# Patient Record
Sex: Female | Born: 1970 | Race: White | Hispanic: No | Smoking: Former smoker
Health system: Southern US, Community
[De-identification: ages and names within clinical notes are randomized; demographics above are authoritative.]

---

## 2004-06-30 ENCOUNTER — Emergency Department: Payer: Self-pay | Admitting: Emergency Medicine

## 2005-09-11 ENCOUNTER — Emergency Department: Payer: Self-pay | Admitting: Emergency Medicine

## 2007-09-13 ENCOUNTER — Emergency Department: Payer: Self-pay | Admitting: Emergency Medicine

## 2008-09-14 ENCOUNTER — Ambulatory Visit: Payer: Self-pay | Admitting: Family Medicine

## 2008-10-18 ENCOUNTER — Emergency Department: Payer: Self-pay | Admitting: Emergency Medicine

## 2009-11-09 ENCOUNTER — Encounter: Payer: Self-pay | Admitting: Otolaryngology

## 2009-11-10 ENCOUNTER — Encounter: Payer: Self-pay | Admitting: Otolaryngology

## 2010-04-02 ENCOUNTER — Emergency Department: Payer: Self-pay | Admitting: Emergency Medicine

## 2011-01-13 ENCOUNTER — Emergency Department: Payer: Self-pay | Admitting: Emergency Medicine

## 2014-04-04 ENCOUNTER — Emergency Department: Payer: Self-pay | Admitting: Emergency Medicine

## 2014-04-04 LAB — CBC
HCT: 45.6 % (ref 35.0–47.0)
HGB: 14.9 g/dL (ref 12.0–16.0)
MCH: 30.3 pg (ref 26.0–34.0)
MCHC: 32.6 g/dL (ref 32.0–36.0)
MCV: 93 fL (ref 80–100)
Platelet: 223 10*3/uL (ref 150–440)
RBC: 4.92 10*6/uL (ref 3.80–5.20)
RDW: 13.9 % (ref 11.5–14.5)
WBC: 10 10*3/uL (ref 3.6–11.0)

## 2014-04-04 LAB — BASIC METABOLIC PANEL
Anion Gap: 7 (ref 7–16)
BUN: 9 mg/dL (ref 7–18)
CALCIUM: 8.7 mg/dL (ref 8.5–10.1)
CHLORIDE: 108 mmol/L — AB (ref 98–107)
Co2: 28 mmol/L (ref 21–32)
Creatinine: 0.77 mg/dL (ref 0.60–1.30)
EGFR (African American): >60
EGFR (Non-African Amer.): >60
Glucose: 104 mg/dL — ABNORMAL HIGH (ref 65–99)
Osmolality: 284 (ref 275–301)
Potassium: 3.7 mmol/L (ref 3.5–5.1)
SODIUM: 143 mmol/L (ref 136–145)

## 2014-04-04 LAB — TROPONIN I: Troponin-I: <0.02 ng/mL

## 2014-04-04 LAB — D-DIMER(ARMC): D-Dimer: 457 ng/ml

## 2014-04-15 ENCOUNTER — Emergency Department: Payer: Self-pay | Admitting: Emergency Medicine

## 2014-04-15 LAB — COMPREHENSIVE METABOLIC PANEL
ALBUMIN: 3.3 g/dL — AB (ref 3.4–5.0)
ALK PHOS: 141 U/L — AB
ALT: 61 U/L
ANION GAP: 9 (ref 7–16)
BUN: 11 mg/dL (ref 7–18)
Bilirubin,Total: 0.2 mg/dL (ref 0.2–1.0)
CALCIUM: 8.8 mg/dL (ref 8.5–10.1)
Chloride: 107 mmol/L (ref 98–107)
Co2: 27 mmol/L (ref 21–32)
Creatinine: 0.78 mg/dL (ref 0.60–1.30)
EGFR (African American): >60
EGFR (Non-African Amer.): >60
Glucose: 117 mg/dL — ABNORMAL HIGH (ref 65–99)
Osmolality: 285 (ref 275–301)
Potassium: 3.9 mmol/L (ref 3.5–5.1)
SGOT(AST): 38 U/L — ABNORMAL HIGH (ref 15–37)
Sodium: 143 mmol/L (ref 136–145)
TOTAL PROTEIN: 6.6 g/dL (ref 6.4–8.2)

## 2014-04-15 LAB — CBC
HCT: 44.6 % (ref 35.0–47.0)
HGB: 14.9 g/dL (ref 12.0–16.0)
MCH: 30.9 pg (ref 26.0–34.0)
MCHC: 33.5 g/dL (ref 32.0–36.0)
MCV: 92 fL (ref 80–100)
Platelet: 270 10*3/uL (ref 150–440)
RBC: 4.83 10*6/uL (ref 3.80–5.20)
RDW: 13.8 % (ref 11.5–14.5)
WBC: 8.8 10*3/uL (ref 3.6–11.0)

## 2014-04-15 LAB — PRO B NATRIURETIC PEPTIDE: B-Type Natriuretic Peptide: 18 pg/mL (ref 0–125)

## 2014-04-15 LAB — TROPONIN I: Troponin-I: <0.02 ng/mL

## 2016-06-14 ENCOUNTER — Emergency Department (HOSPITAL_COMMUNITY): Payer: No Typology Code available for payment source

## 2016-06-14 ENCOUNTER — Emergency Department (HOSPITAL_COMMUNITY)
Admission: EM | Admit: 2016-06-14 | Discharge: 2016-06-14 | Disposition: A | Discharge status: Home / Self Care | Payer: No Typology Code available for payment source | Attending: Emergency Medicine | Admitting: Emergency Medicine

## 2016-06-14 ENCOUNTER — Encounter (HOSPITAL_COMMUNITY): Payer: Self-pay | Admitting: Emergency Medicine

## 2016-06-14 DIAGNOSIS — M5020 Other cervical disc displacement, unspecified cervical region: Secondary | ICD-10-CM | POA: Insufficient documentation

## 2016-06-14 DIAGNOSIS — M5030 Other cervical disc degeneration, unspecified cervical region: Secondary | ICD-10-CM

## 2016-06-14 DIAGNOSIS — Y92410 Unspecified street and highway as the place of occurrence of the external cause: Secondary | ICD-10-CM | POA: Diagnosis not present

## 2016-06-14 DIAGNOSIS — Y939 Activity, unspecified: Secondary | ICD-10-CM | POA: Diagnosis not present

## 2016-06-14 DIAGNOSIS — S199XXA Unspecified injury of neck, initial encounter: Secondary | ICD-10-CM | POA: Diagnosis present

## 2016-06-14 DIAGNOSIS — R51 Headache: Secondary | ICD-10-CM | POA: Insufficient documentation

## 2016-06-14 DIAGNOSIS — Y999 Unspecified external cause status: Secondary | ICD-10-CM | POA: Diagnosis not present

## 2016-06-14 DIAGNOSIS — Z79899 Other long term (current) drug therapy: Secondary | ICD-10-CM | POA: Diagnosis not present

## 2016-06-14 DIAGNOSIS — S161XXA Strain of muscle, fascia and tendon at neck level, initial encounter: Secondary | ICD-10-CM | POA: Diagnosis not present

## 2016-06-14 MED ORDER — OXYCODONE-ACETAMINOPHEN 5-325 MG PO TABS
2.0000 | ORAL_TABLET | Freq: Once | ORAL | Status: AC
  Administered 2016-06-14: 2 via ORAL
  Filled 2016-06-14: qty 2

## 2016-06-14 MED ORDER — HYDROCODONE-ACETAMINOPHEN 5-325 MG PO TABS: 1.0000 | ORAL_TABLET | Freq: Four times a day (QID) | ORAL | 0 refills | Status: DC | PRN

## 2016-06-14 MED ORDER — METHOCARBAMOL 500 MG PO TABS
500.0000 mg | ORAL_TABLET | Freq: Once | ORAL | Status: AC
  Administered 2016-06-14: 500 mg via ORAL
  Filled 2016-06-14: qty 1

## 2016-06-14 MED ORDER — METHOCARBAMOL 500 MG PO TABS: 500.0000 mg | ORAL_TABLET | Freq: Two times a day (BID) | ORAL | 0 refills | Status: DC

## 2016-06-14 MED ORDER — NAPROXEN 500 MG PO TABS: 500.0000 mg | ORAL_TABLET | Freq: Two times a day (BID) | ORAL | 0 refills | Status: DC
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
## 2016-06-14 NOTE — ED Notes (Signed)
Patient transported to CT 

## 2016-06-14 NOTE — Discharge Instructions (Signed)
1. Medications: robaxin, naproxyn, vicodin, usual home medications 2. Treatment: rest, drink plenty of fluids, wear cervical collar until reassess, alternate ice and heat 3. Follow Up: Please followup with neurosurgery at the earliest appointment for discussion of your diagnoses and further evaluation after today's visit; if you do not have a primary care doctor use the resource guide provided to find one;  Return to the ER for worsening back pain, difficulty walking, loss of bowel or bladder control, neurologic symptoms or other concerning symptoms
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
## 2017-03-17 ENCOUNTER — Encounter: Payer: Self-pay | Admitting: Emergency Medicine

## 2017-03-17 ENCOUNTER — Emergency Department
Admission: EM | Admit: 2017-03-17 | Discharge: 2017-03-17 | Disposition: A | Discharge status: Home / Self Care | Payer: Self-pay | Attending: Emergency Medicine | Admitting: Emergency Medicine

## 2017-03-17 DIAGNOSIS — H6122 Impacted cerumen, left ear: Secondary | ICD-10-CM | POA: Insufficient documentation

## 2017-03-17 DIAGNOSIS — Z87891 Personal history of nicotine dependence: Secondary | ICD-10-CM | POA: Insufficient documentation

## 2017-03-17 NOTE — Discharge Instructions (Signed)
By over-the-counter DEBrox. Use once a week prior to a shower. If you have any problems with the ear please return to the emergency Department or go to [facility] clinic acute care

## 2017-03-17 NOTE — ED Provider Notes (Signed)
[facility] Emergency Department Provider Note  ____________________________________________   First MD Initiated Contact with Patient 03/17/17 1722     (approximate)  I have reviewed the triage vital signs and the nursing notes.   HISTORY  Chief Complaint Ear Fullness    HPI Courtney Hicks is a 46 y.o. female complains of not being able to hear out of the left ear. States she thinks she might have broken a Q-tip off while trying to get wax out. Denies fever or chills. No cold symptoms. No drainage from the ear. No trauma that she knows of other than having a Q-tip in the ear. Has tried to open the area up by holding her nose.   History reviewed. No pertinent past medical history.  There are no active problems to display for this patient.   History reviewed. No pertinent surgical history.  Prior to Admission medications   Not on File    Allergies Patient has no known allergies.  No family history on file.  Social History Social History  Substance Use Topics  . Smoking status: Former Games developer  . Smokeless tobacco: Not on file  . Alcohol use Not on file    Review of Systems  Constitutional: No fever/chills Eyes: No visual changes. ENT: No sore throat.Positive. Loss Respiratory: Denies cough Genitourinary: Negative for dysuria. Musculoskeletal: Negative for back pain. Skin: Negative for rash.    ____________________________________________   PHYSICAL EXAM:  VITAL SIGNS: ED Triage Vitals  Enc Vitals Group     BP 03/17/17 1707 121/72     Pulse Rate 03/17/17 1707 80     Resp 03/17/17 1707 16     Temp 03/17/17 1707 98.6 F (37 C)     Temp Source 03/17/17 1707 Oral     SpO2 03/17/17 1707 98 %     Weight 03/17/17 1708 210 lb (95.3 kg)     Height 03/17/17 1708  (1.6 m)     Head Circumference --      Peak Flow --      Pain Score 03/17/17 1713 0     Pain Loc --      Pain Edu? --      Excl. in GC? --      Constitutional: Alert and oriented. Well appearing and in no acute distress. Eyes: Conjunctivae are normal.  Head: Atraumatic. Nose: No congestion/rhinnorhea. Mouth/Throat: Mucous membranes are moist.   Ears: Left ear has cerumen impaction in the canal. NO fb noted Cardiovascular: Normal rate, regular rhythm. Respiratory: Normal respiratory effort.  No retractions GU: deferred Musculoskeletal: FROM all extremities, warm and well perfused Neurologic:  Normal speech and language.  Skin:  Skin is warm, dry and intact. No rash noted. Psychiatric: Mood and affect are normal. Speech and behavior are normal.  ____________________________________________   LABS (all labs ordered are listed, but only abnormal results are displayed)  Labs Reviewed - No data to display ____________________________________________   ____________________________________________  RADIOLOGY  none  ____________________________________________   PROCEDURES  Procedure(s) performed: Ear irrigation performed by RN      ____________________________________________   INITIAL IMPRESSION / ASSESSMENT AND PLAN / ED COURSE  Pertinent labs & imaging results that were available during my care of the patient were reviewed by me and considered in my medical decision making (see chart for details).  H and appears well. Patient has a cerumen impaction which is causing hearing loss.      ____________________________________________   FINAL CLINICAL IMPRESSION(S) / ED DIAGNOSES  Final diagnoses:  Impacted cerumen of left ear      NEW MEDICATIONS STARTED DURING THIS VISIT:  Discharge Medication List as of 03/17/2017  5:45 PM       Note:  This document was prepared using Dragon voice recognition software and may include unintentional dictation errors.    Merrily Brittle, MD 03/19/17 2223

## 2017-03-17 NOTE — ED Triage Notes (Signed)
States was cleaning ear with q tip last night, R, felt pop and trouble hearing since.
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
## 2020-11-30 ENCOUNTER — Other Ambulatory Visit (HOSPITAL_BASED_OUTPATIENT_CLINIC_OR_DEPARTMENT_OTHER): Payer: Self-pay

## 2020-11-30 ENCOUNTER — Ambulatory Visit: Payer: Self-pay

## 2021-08-08 DIAGNOSIS — R252 Cramp and spasm: Secondary | ICD-10-CM | POA: Diagnosis not present

## 2021-08-08 DIAGNOSIS — M25562 Pain in left knee: Secondary | ICD-10-CM | POA: Diagnosis not present

## 2021-09-02 DIAGNOSIS — M25562 Pain in left knee: Secondary | ICD-10-CM | POA: Diagnosis not present

## 2022-06-16 DIAGNOSIS — Z79899 Other long term (current) drug therapy: Secondary | ICD-10-CM | POA: Diagnosis not present

## 2022-06-16 DIAGNOSIS — R600 Localized edema: Secondary | ICD-10-CM | POA: Diagnosis not present

## 2022-09-22 DIAGNOSIS — I4510 Unspecified right bundle-branch block: Secondary | ICD-10-CM | POA: Diagnosis not present

## 2022-09-22 DIAGNOSIS — R600 Localized edema: Secondary | ICD-10-CM | POA: Diagnosis not present

## 2022-09-22 DIAGNOSIS — R42 Dizziness and giddiness: Secondary | ICD-10-CM | POA: Diagnosis not present

## 2022-09-22 DIAGNOSIS — R9431 Abnormal electrocardiogram [ECG] [EKG]: Secondary | ICD-10-CM | POA: Diagnosis not present

## 2022-09-26 DIAGNOSIS — E7800 Pure hypercholesterolemia, unspecified: Secondary | ICD-10-CM | POA: Diagnosis not present

## 2022-09-26 DIAGNOSIS — R5383 Other fatigue: Secondary | ICD-10-CM | POA: Diagnosis not present

## 2022-09-26 DIAGNOSIS — D539 Nutritional anemia, unspecified: Secondary | ICD-10-CM | POA: Diagnosis not present

## 2022-09-26 DIAGNOSIS — Z79899 Other long term (current) drug therapy: Secondary | ICD-10-CM | POA: Diagnosis not present

## 2022-09-26 DIAGNOSIS — E559 Vitamin D deficiency, unspecified: Secondary | ICD-10-CM | POA: Diagnosis not present

## 2022-09-26 DIAGNOSIS — E88819 Insulin resistance, unspecified: Secondary | ICD-10-CM | POA: Diagnosis not present

## 2022-09-26 DIAGNOSIS — Z131 Encounter for screening for diabetes mellitus: Secondary | ICD-10-CM | POA: Diagnosis not present

## 2022-10-02 DIAGNOSIS — R002 Palpitations: Secondary | ICD-10-CM | POA: Diagnosis not present

## 2022-10-20 DIAGNOSIS — I4510 Unspecified right bundle-branch block: Secondary | ICD-10-CM | POA: Diagnosis not present

## 2022-10-23 DIAGNOSIS — R7303 Prediabetes: Secondary | ICD-10-CM | POA: Diagnosis not present

## 2022-10-23 DIAGNOSIS — Z79899 Other long term (current) drug therapy: Secondary | ICD-10-CM | POA: Diagnosis not present

## 2022-11-09 DIAGNOSIS — Z6841 Body Mass Index (BMI) 40.0 and over, adult: Secondary | ICD-10-CM | POA: Diagnosis not present

## 2022-11-09 DIAGNOSIS — R600 Localized edema: Secondary | ICD-10-CM | POA: Diagnosis not present

## 2022-11-09 DIAGNOSIS — E7800 Pure hypercholesterolemia, unspecified: Secondary | ICD-10-CM | POA: Diagnosis not present

## 2022-11-09 DIAGNOSIS — F17200 Nicotine dependence, unspecified, uncomplicated: Secondary | ICD-10-CM | POA: Diagnosis not present

## 2022-11-23 DIAGNOSIS — R635 Abnormal weight gain: Secondary | ICD-10-CM | POA: Diagnosis not present

## 2022-11-23 DIAGNOSIS — E559 Vitamin D deficiency, unspecified: Secondary | ICD-10-CM | POA: Diagnosis not present

## 2022-11-23 DIAGNOSIS — F17210 Nicotine dependence, cigarettes, uncomplicated: Secondary | ICD-10-CM | POA: Diagnosis not present

## 2022-11-23 DIAGNOSIS — R7303 Prediabetes: Secondary | ICD-10-CM | POA: Diagnosis not present

## 2022-11-23 DIAGNOSIS — N914 Secondary oligomenorrhea: Secondary | ICD-10-CM | POA: Diagnosis not present

## 2022-12-22 DIAGNOSIS — R635 Abnormal weight gain: Secondary | ICD-10-CM | POA: Diagnosis not present

## 2022-12-22 DIAGNOSIS — Z79899 Other long term (current) drug therapy: Secondary | ICD-10-CM | POA: Diagnosis not present

## 2022-12-22 DIAGNOSIS — Z6841 Body Mass Index (BMI) 40.0 and over, adult: Secondary | ICD-10-CM | POA: Diagnosis not present

## 2022-12-22 DIAGNOSIS — R7303 Prediabetes: Secondary | ICD-10-CM | POA: Diagnosis not present

## 2023-01-19 DIAGNOSIS — R7303 Prediabetes: Secondary | ICD-10-CM | POA: Diagnosis not present

## 2023-01-19 DIAGNOSIS — Z79899 Other long term (current) drug therapy: Secondary | ICD-10-CM | POA: Diagnosis not present

## 2023-01-19 DIAGNOSIS — R635 Abnormal weight gain: Secondary | ICD-10-CM | POA: Diagnosis not present

## 2023-01-19 DIAGNOSIS — Z6841 Body Mass Index (BMI) 40.0 and over, adult: Secondary | ICD-10-CM | POA: Diagnosis not present

## 2023-01-23 DIAGNOSIS — Z79899 Other long term (current) drug therapy: Secondary | ICD-10-CM | POA: Diagnosis not present

## 2023-02-02 DIAGNOSIS — E559 Vitamin D deficiency, unspecified: Secondary | ICD-10-CM | POA: Diagnosis not present

## 2023-02-02 DIAGNOSIS — F17210 Nicotine dependence, cigarettes, uncomplicated: Secondary | ICD-10-CM | POA: Diagnosis not present

## 2023-02-02 DIAGNOSIS — Z1231 Encounter for screening mammogram for malignant neoplasm of breast: Secondary | ICD-10-CM | POA: Diagnosis not present

## 2023-02-02 DIAGNOSIS — N914 Secondary oligomenorrhea: Secondary | ICD-10-CM | POA: Diagnosis not present

## 2023-02-02 DIAGNOSIS — R7303 Prediabetes: Secondary | ICD-10-CM | POA: Diagnosis not present

## 2023-03-02 DIAGNOSIS — F17210 Nicotine dependence, cigarettes, uncomplicated: Secondary | ICD-10-CM | POA: Diagnosis not present

## 2023-03-02 DIAGNOSIS — Z23 Encounter for immunization: Secondary | ICD-10-CM | POA: Diagnosis not present

## 2023-03-02 DIAGNOSIS — R7303 Prediabetes: Secondary | ICD-10-CM | POA: Diagnosis not present

## 2023-03-02 DIAGNOSIS — N914 Secondary oligomenorrhea: Secondary | ICD-10-CM | POA: Diagnosis not present

## 2023-03-02 DIAGNOSIS — E559 Vitamin D deficiency, unspecified: Secondary | ICD-10-CM | POA: Diagnosis not present

## 2023-03-26 DIAGNOSIS — F900 Attention-deficit hyperactivity disorder, predominantly inattentive type: Secondary | ICD-10-CM | POA: Diagnosis not present

## 2023-03-26 DIAGNOSIS — F4322 Adjustment disorder with anxiety: Secondary | ICD-10-CM | POA: Diagnosis not present

## 2023-07-19 DIAGNOSIS — E669 Obesity, unspecified: Secondary | ICD-10-CM | POA: Diagnosis not present

## 2023-07-23 DIAGNOSIS — R7303 Prediabetes: Secondary | ICD-10-CM | POA: Diagnosis not present

## 2023-07-23 DIAGNOSIS — E7800 Pure hypercholesterolemia, unspecified: Secondary | ICD-10-CM | POA: Diagnosis not present

## 2023-07-23 DIAGNOSIS — Z79899 Other long term (current) drug therapy: Secondary | ICD-10-CM | POA: Diagnosis not present

## 2023-07-23 DIAGNOSIS — E559 Vitamin D deficiency, unspecified: Secondary | ICD-10-CM | POA: Diagnosis not present

## 2023-07-23 DIAGNOSIS — R5383 Other fatigue: Secondary | ICD-10-CM | POA: Diagnosis not present

## 2023-08-16 DIAGNOSIS — E669 Obesity, unspecified: Secondary | ICD-10-CM | POA: Diagnosis not present

## 2023-08-20 DIAGNOSIS — R7989 Other specified abnormal findings of blood chemistry: Secondary | ICD-10-CM | POA: Diagnosis not present

## 2023-08-27 DIAGNOSIS — Z0000 Encounter for general adult medical examination without abnormal findings: Secondary | ICD-10-CM | POA: Diagnosis not present

## 2023-08-27 DIAGNOSIS — D539 Nutritional anemia, unspecified: Secondary | ICD-10-CM | POA: Diagnosis not present

## 2023-09-20 ENCOUNTER — Other Ambulatory Visit: Payer: Self-pay | Admitting: Physician Assistant

## 2023-09-20 DIAGNOSIS — Z122 Encounter for screening for malignant neoplasm of respiratory organs: Secondary | ICD-10-CM

## 2023-09-20 DIAGNOSIS — R42 Dizziness and giddiness: Secondary | ICD-10-CM

## 2023-09-24 ENCOUNTER — Encounter: Payer: Self-pay | Admitting: Physician Assistant

## 2023-09-25 ENCOUNTER — Other Ambulatory Visit: Payer: Self-pay

## 2023-10-01 ENCOUNTER — Other Ambulatory Visit

## 2024-04-01 ENCOUNTER — Encounter: Payer: Self-pay | Admitting: Family Medicine

## 2024-04-01 ENCOUNTER — Ambulatory Visit (INDEPENDENT_AMBULATORY_CARE_PROVIDER_SITE_OTHER): Admitting: Family Medicine

## 2024-04-01 VITALS — BP 116/88 | Ht 63.0 in | Wt 246.0 lb

## 2024-04-01 DIAGNOSIS — M25562 Pain in left knee: Secondary | ICD-10-CM

## 2024-04-01 DIAGNOSIS — G8929 Other chronic pain: Secondary | ICD-10-CM

## 2024-04-01 DIAGNOSIS — M216X2 Other acquired deformities of left foot: Secondary | ICD-10-CM

## 2024-04-01 DIAGNOSIS — M21611 Bunion of right foot: Secondary | ICD-10-CM | POA: Diagnosis not present

## 2024-04-01 DIAGNOSIS — M216X1 Other acquired deformities of right foot: Secondary | ICD-10-CM | POA: Diagnosis not present

## 2024-04-01 DIAGNOSIS — M21612 Bunion of left foot: Secondary | ICD-10-CM

## 2024-04-01 NOTE — Progress Notes (Signed)
 DATE OF VISIT: 04/01/2024        Courtney Hicks DOB: 1970-10-05 MRN: [id number]  CC: Lt knee pain, Bilateral foot pain, Left ankle pain  L Foot Pain, R ankle and L Knee Pain  History- Courtney Hicks is a 53 y.o. female for evaluation and treatment of knee pain and foot/ankle pain.  L Knee Pain  - History of chronic left knee pain for several years.  Previously seen by outside provider Dr. Velinda Citron at [facility].  Last seen by him in 2023 - Patient notes longstanding history of knee osteoarthritis, will get periodic flares - Has been having increasing pain and spasms recently, feels like her typical flare - Usually flares with being on her feet and walking longer distances - Eyes any specific injury or trauma - Does not typically take NSAIDs due to concerns of potential kidney injuries.  Denies any underlying history of chronic kidney disease - Taking turmeric and MVI - She tries to stay active and hit anywhere from 2500-3000K steps per day - does not wear a knee brace or sleeve -Does ambulate with the assistance of a single forearm crutch - No hx of CSI, no hx of joint aspiration - Has never gone to PT  - last plain films in 2023 - States that she is not interested in injections or surgery.  States that her sister had poor outcome from a knee replacement, so she wants to stay away from surgery  Foot/ankle pain - History of chronic foot and ankle pain - Feels like it has been getting worse over the last year, acutely worsened over the last few weeks - Pain will start in her feet and radiate up into the ankles - If she steps on it the wrong way, it feels like a stabbing pain. Having a hard time explaining exactly how it feels -States it feels somewhat comfortable in her crocs sandals.  She did note recently purchasing a new pair of ASICS which are also somewhat comfortable - Denies toe swelling  - No hx of gout or pseudogout - hx of prediabetes/diabetes - No trauma to the  area - Has tried orthotics in the past - states they did not help - she is unsure if she has ever tried custom orthotics - Has never seen a podiatrist - No back or hip pain. No fevers, chills, night sweats.  - Denies neuropathic pain. - No new rashes.  - No hx of CKD but wants to protect her kidney's so she avoids NSAIDs and drinks a lot of water.   Also requesting ADA paperwork to be completed.  She does not have this with her.  States that Dr. Citron had previously given her a letter in March 2023.  She has not been back to see him since then.  She notes that she tried to call their office to schedule an appointment because she could not do so online and fell asleep while waiting on hold and was never able to speak with somebody at the office.  Job: She works for [employer] - states she is currently work from home and needs updated ADA accommodations before 04/10/2024, otherwise she will need to return to the office.  Past Medical History History reviewed. No pertinent past medical history.  Past Surgical History History reviewed. No pertinent surgical history.  Medications No current outpatient medications on file.   No current facility-administered medications for this visit.    Allergies has no known allergies.  Family History - reviewed  per EMR and intake form  Social History   has no history on file for alcohol use.  reports that she has quit smoking. She does not have any smokeless tobacco history on file.  has no history on file for drug use. OCCUPATION: Job: She works for [employer] - states she is currently work from home and needs updated ADA accommodations before 04/10/2024, otherwise she will need to return to the office.   EXAM: Vitals: BP 116/88   Ht 5' 3 (1.6 m)   Wt 246 lb (111.6 kg)   BMI 43.58 kg/m  General: AOx3, NAD, pleasant SKIN: no rashes or lesions, skin clean, dry, intact MSK: Knee: Left knee with trace effusion.  No increased redness or warmth.   Near full range of motion with 1+ patellofemoral crepitus.  Medial joint line tenderness, no lateral joint line tenderness.  Negative McMurray.  Negative Lachman, negative varus and valgus stress test. Right knee with full range of motion without pain, weakness, instability  Foot/ankle: Moderate bunion formation bilaterally, has transverse arch breakdown bilaterally with splaying between the 1st and 2nd toes.  Has collapse of longitudinal arches bilaterally with associated overpronation.  Callus noted along the first MTP on the plantar aspect of the foot bilaterally.  No increased redness, no increased warmth, no skin breakdown.  Good range of motion of the ankles bilaterally without pain.  No swelling or effusion.  No bony tenderness throughout the foot or the ankle bilaterally.  Good great toe motion bilaterally, no hallux rigidus.  Ankle strength 5/5 throughout  NEURO: Sensation intact to light touch lower extremity bilaterally Vascular: Trace lower extremity edema bilaterally.  Pulses 2+ and symmetric DP/PT bilaterally  Assessment & Plan  1. Chronic pain of left knee Chronic left knee pain with associated acute exacerbation, suspect underlying osteoarthritis  Plan: - Previously followed with Dr. Delane with [facility].  Limited records available for review in care everywhere. - Recommend obtaining updated x-rays to assess for underlying degree of osteoarthritis. -Discussed role of physical therapy and possible aquatics therapy.  He would potentially be interested - Also discussed that pending updated x-rays, she may be potential candidate for further treatment such as injection therapy, bracing, surgery.  Some of these could significantly help her mobility and her symptoms. - Advised patient that I am unable to complete her paperwork at this time, I would need updated x-rays and records from Dr. Delane to further review her history and underlying condition.  Explained that we are happy to  place order for her to get x-rays with [facility] imaging.  She declined at this time.  Also with need for requesting records from Dr. Delane and understanding that it may take a few days or a few weeks to obtain the records, she prefers to try to follow-up with Dr. Delane to further discuss her paperwork needs. 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 Loss of transverse plantar arch of right foot Chronic bilateral foot and ankle pain with associated transverse and longitudinal arch collapse, as well as bunions  Plan: - Diagnosis and treatment discussed - Patient would benefit from sports insoles with likely scaphoid pad and first ray post.  Patient only has flip-flops with her today.  Discussed potential out of pocket cost of up to $40 and that would be best to try to fit  her with her regular shoes.  She is recommended to schedule follow-up appointment and bring her shoes so we could get her fitted with inserts.  She will consider this in the future, but likely plans to follow-up with Dr. Delane instead - Recommend that she wear good supportive shoes with good arch supports and wide toebox - She would potentially be candidate for custom orthotics in the future.  She is aware of potential cost of $190 if not covered by insurance - She will schedule follow-up with us  in the future if she decides to proceed with this   Patient expressed understanding & agreement with above.  Encounter Diagnoses  Name Primary?   Chronic pain of left knee Yes   Bilateral bunions    Loss of transverse plantar arch of left foot    Loss of transverse plantar arch of right foot

## 2024-04-03 DIAGNOSIS — M21621 Bunionette of right foot: Secondary | ICD-10-CM | POA: Diagnosis not present

## 2024-04-03 DIAGNOSIS — M25562 Pain in left knee: Secondary | ICD-10-CM | POA: Diagnosis not present

## 2024-05-07 DIAGNOSIS — M21621 Bunionette of right foot: Secondary | ICD-10-CM | POA: Diagnosis not present

## 2024-05-07 DIAGNOSIS — M1712 Unilateral primary osteoarthritis, left knee: Secondary | ICD-10-CM | POA: Diagnosis not present

## 2024-05-07 DIAGNOSIS — M25562 Pain in left knee: Secondary | ICD-10-CM | POA: Diagnosis not present
